# Patient Record
Sex: Female | Born: 1965 | Race: White | Hispanic: No | Marital: Married | Smoking: Never smoker
Health system: Southern US, Community
[De-identification: ages and names within clinical notes are randomized; demographics above are authoritative.]

## PROBLEM LIST (undated history)

## (undated) ENCOUNTER — Emergency Department (HOSPITAL_COMMUNITY): Discharge status: Absent without leave | Payer: Self-pay

---

## 1997-10-17 ENCOUNTER — Other Ambulatory Visit: Admission: RE | Admit: 1997-10-17 | Discharge: 1997-10-17 | Payer: Self-pay | Admitting: Obstetrics and Gynecology

## 1997-11-01 ENCOUNTER — Ambulatory Visit (HOSPITAL_COMMUNITY): Admission: RE | Admit: 1997-11-01 | Discharge: 1997-11-01 | Payer: Self-pay | Admitting: Obstetrics and Gynecology

## 1998-01-09 ENCOUNTER — Other Ambulatory Visit: Admission: RE | Admit: 1998-01-09 | Discharge: 1998-01-09 | Payer: Self-pay | Admitting: Obstetrics and Gynecology

## 1999-01-14 ENCOUNTER — Ambulatory Visit (HOSPITAL_COMMUNITY): Admission: RE | Admit: 1999-01-14 | Discharge: 1999-01-14 | Payer: Self-pay | Admitting: Obstetrics and Gynecology

## 1999-01-14 ENCOUNTER — Encounter: Payer: Self-pay | Admitting: Obstetrics and Gynecology

## 1999-07-25 ENCOUNTER — Other Ambulatory Visit: Admission: RE | Admit: 1999-07-25 | Discharge: 1999-07-25 | Payer: Self-pay | Admitting: Obstetrics and Gynecology

## 2001-01-19 ENCOUNTER — Other Ambulatory Visit: Admission: RE | Admit: 2001-01-19 | Discharge: 2001-01-19 | Payer: Self-pay | Admitting: Obstetrics and Gynecology

## 2002-03-07 ENCOUNTER — Other Ambulatory Visit: Admission: RE | Admit: 2002-03-07 | Discharge: 2002-03-07 | Payer: Self-pay | Admitting: Obstetrics and Gynecology

## 2003-04-17 ENCOUNTER — Other Ambulatory Visit: Admission: RE | Admit: 2003-04-17 | Discharge: 2003-04-17 | Payer: Self-pay | Admitting: Obstetrics and Gynecology

## 2004-04-02 ENCOUNTER — Ambulatory Visit (HOSPITAL_COMMUNITY): Admission: RE | Admit: 2004-04-02 | Discharge: 2004-04-02 | Payer: Self-pay | Admitting: Obstetrics and Gynecology

## 2004-05-08 ENCOUNTER — Other Ambulatory Visit: Admission: RE | Admit: 2004-05-08 | Discharge: 2004-05-08 | Payer: Self-pay | Admitting: Obstetrics and Gynecology

## 2004-06-05 ENCOUNTER — Ambulatory Visit (HOSPITAL_COMMUNITY): Admission: RE | Admit: 2004-06-05 | Discharge: 2004-06-05 | Payer: Self-pay | Admitting: Obstetrics and Gynecology

## 2004-08-27 ENCOUNTER — Encounter: Admission: RE | Admit: 2004-08-27 | Discharge: 2004-08-27 | Payer: Self-pay | Admitting: Obstetrics and Gynecology

## 2004-10-06 ENCOUNTER — Ambulatory Visit (HOSPITAL_COMMUNITY): Admission: RE | Admit: 2004-10-06 | Discharge: 2004-10-06 | Payer: Self-pay | Admitting: Obstetrics and Gynecology

## 2004-10-15 ENCOUNTER — Inpatient Hospital Stay (HOSPITAL_COMMUNITY): Admission: AD | Admit: 2004-10-15 | Discharge: 2004-10-18 | Payer: Self-pay | Admitting: Obstetrics and Gynecology

## 2005-07-01 ENCOUNTER — Encounter: Payer: Self-pay | Admitting: Obstetrics and Gynecology

## 2005-08-18 ENCOUNTER — Other Ambulatory Visit: Admission: RE | Admit: 2005-08-18 | Discharge: 2005-08-18 | Payer: Self-pay | Admitting: Obstetrics and Gynecology

## 2005-08-24 ENCOUNTER — Encounter: Admission: RE | Admit: 2005-08-24 | Discharge: 2005-08-24 | Payer: Self-pay | Admitting: Obstetrics and Gynecology

## 2005-09-08 ENCOUNTER — Encounter: Admission: RE | Admit: 2005-09-08 | Discharge: 2005-09-08 | Payer: Self-pay | Admitting: General Surgery

## 2005-09-08 ENCOUNTER — Encounter (INDEPENDENT_AMBULATORY_CARE_PROVIDER_SITE_OTHER): Payer: Self-pay | Admitting: Specialist

## 2005-12-22 ENCOUNTER — Encounter: Admission: RE | Admit: 2005-12-22 | Discharge: 2005-12-22 | Payer: Self-pay | Admitting: Obstetrics and Gynecology

## 2006-04-20 ENCOUNTER — Encounter: Admission: RE | Admit: 2006-04-20 | Discharge: 2006-04-20 | Payer: Self-pay | Admitting: General Surgery

## 2006-09-21 IMAGING — US US OB COMP LESS 14 WK
1 series · 7 of 7 positions shown · non-contrast
Comparison: none

CLINICAL DATA: Questionable dates.  
EARLY OBSTETRICAL ULTRASOUND: 
Multiple images of the uterus and adnexa were obtained using a transabdominal and endovaginal approaches.
There is a single intrauterine pregnancy identified that demonstrates an estimated gestational age by crown rump length of 11 weeks and 4 days.  Positive regular fetal cardiac activity with a rate of 144 bpm was noted.  A normal appearing yolk sac and amnion are seen.  No signs of subchorionic hemorrhage are evident.  Attempts at obtaining a correct position for nuchal translucency evaluation was not possible today.  
Both ovaries were seen and have a normal appearance with the right ovary measuring 3.1 x 1.9 x 2.3 cm and the left ovary measuring 2.4 x 3.2 x 1.4 cm.    No cul-de-sac or periovarian fluid is seen and no separate adnexal masses are apparent.

[Series 1: us ob comp less 14 wk · 0.33mm/px · 7 of 7 slices shown]
[im 1/7]
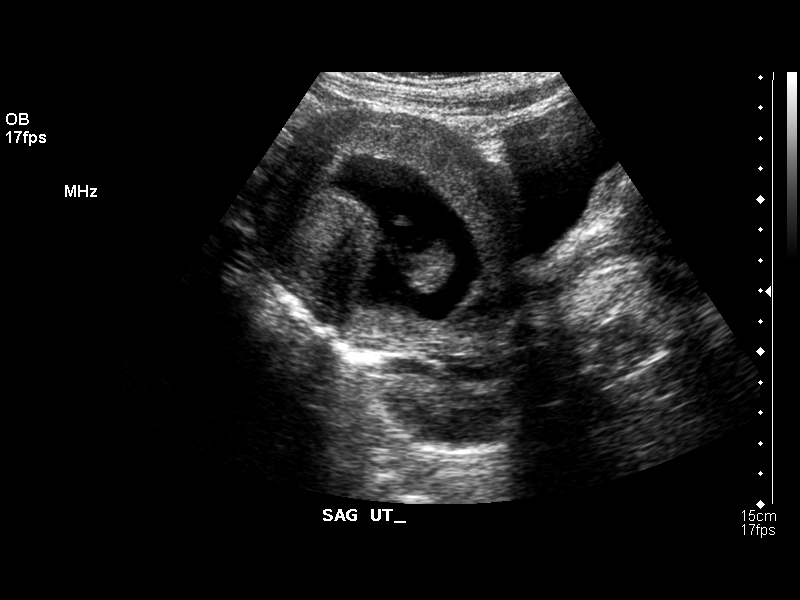
[im 2/7]
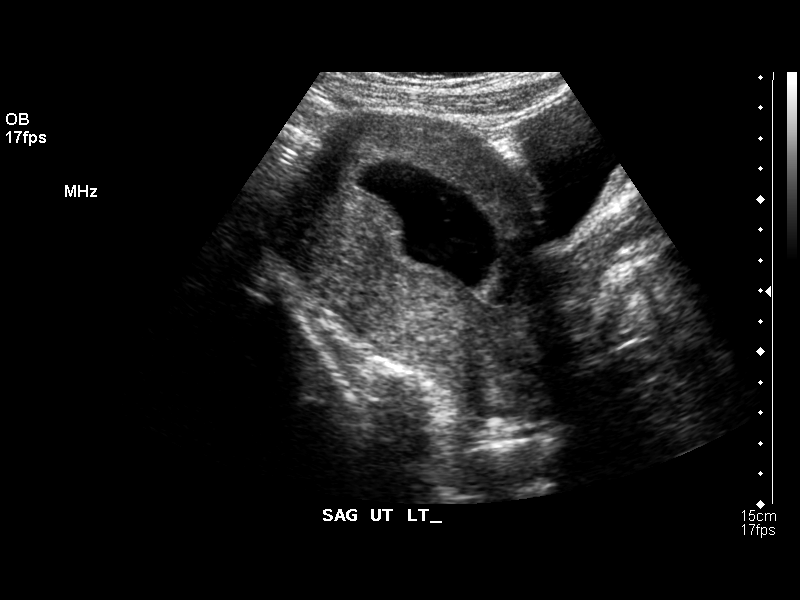
[im 3/7]
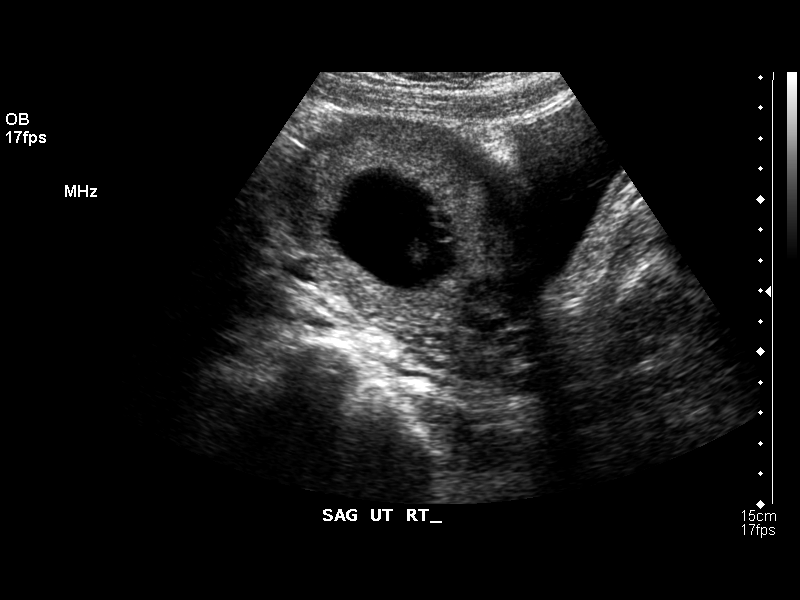
[im 4/7]
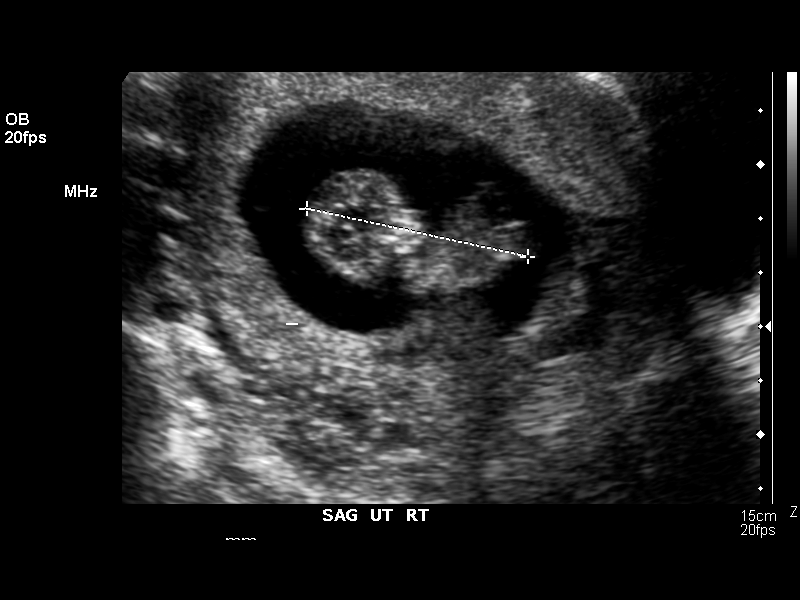
[im 5/7]
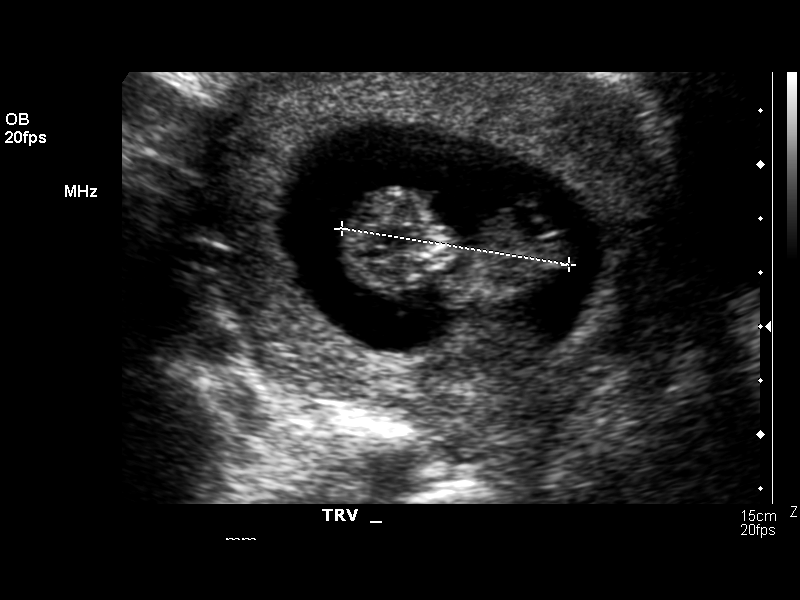
[im 6/7]
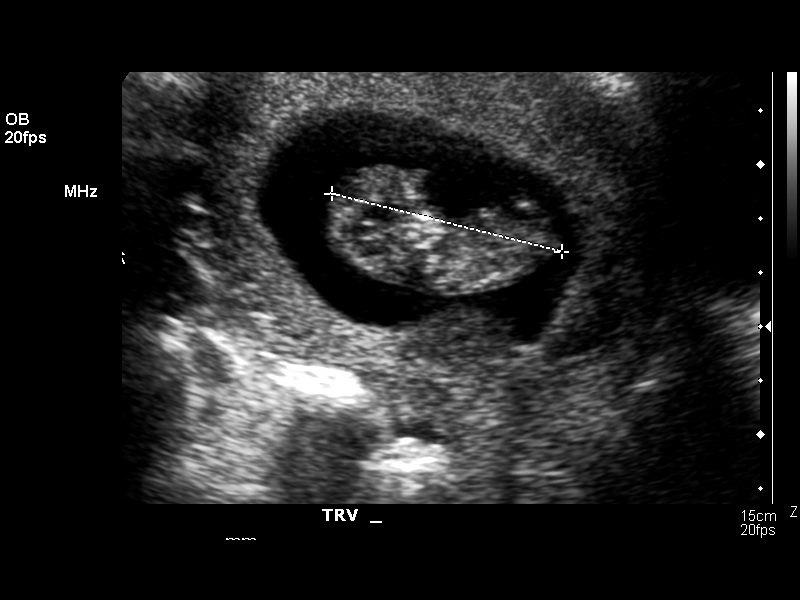
[im 7/7]
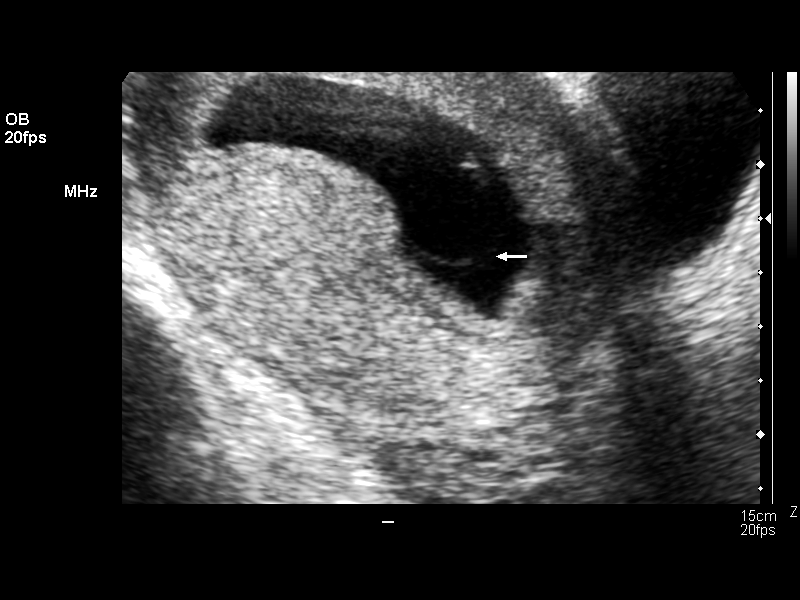

[7 of 7 positions shown; findings below may reference images not displayed]

IMPRESSION: 11 week 4 day living intrauterine pregnancy.  Normal ovaries.

## 2006-11-23 ENCOUNTER — Encounter (HOSPITAL_BASED_OUTPATIENT_CLINIC_OR_DEPARTMENT_OTHER): Payer: Self-pay | Admitting: General Surgery

## 2006-11-23 ENCOUNTER — Ambulatory Visit (HOSPITAL_BASED_OUTPATIENT_CLINIC_OR_DEPARTMENT_OTHER): Admission: RE | Admit: 2006-11-23 | Discharge: 2006-11-23 | Payer: Self-pay | Admitting: General Surgery

## 2006-11-24 IMAGING — US US OB DETAIL+14 WK
1 series · 13 of 28 positions shown · non-contrast
Comparison: none

CLINICAL DATA: Advanced maternal age. Assess fetal anatomy.  No current problems.

[Series 1: us ob detail+14 wk · 0.29mm/px · 13 of 83 slices shown]
[im 4/83]
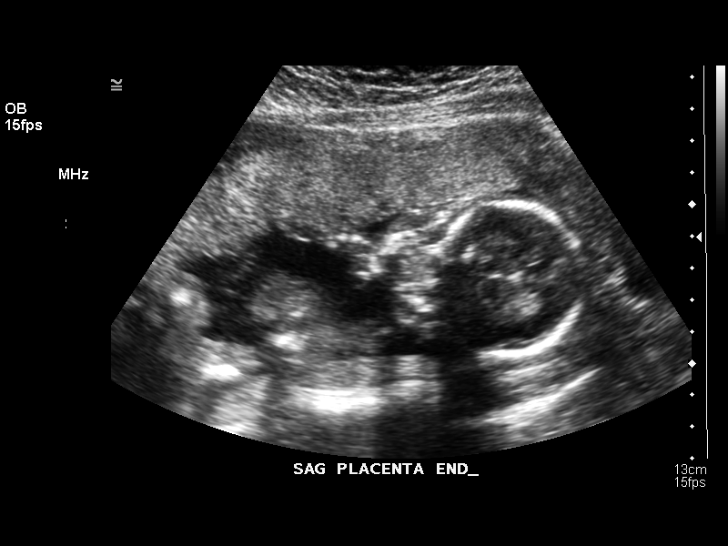
[im 10/83]
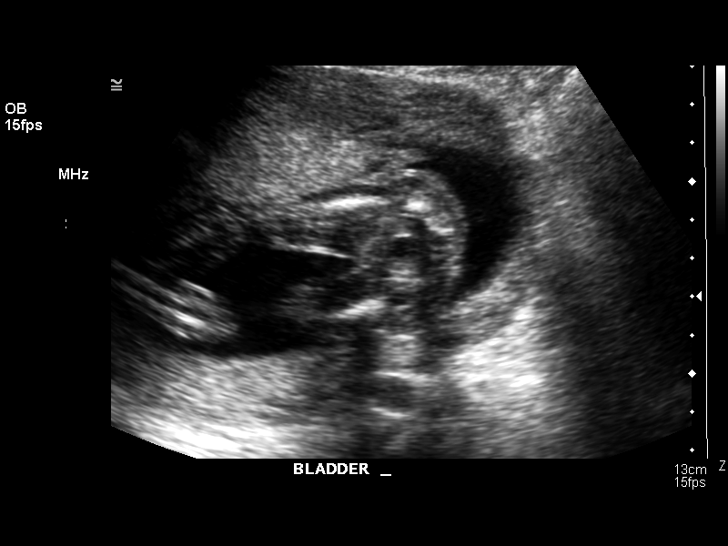
[im 16/83]
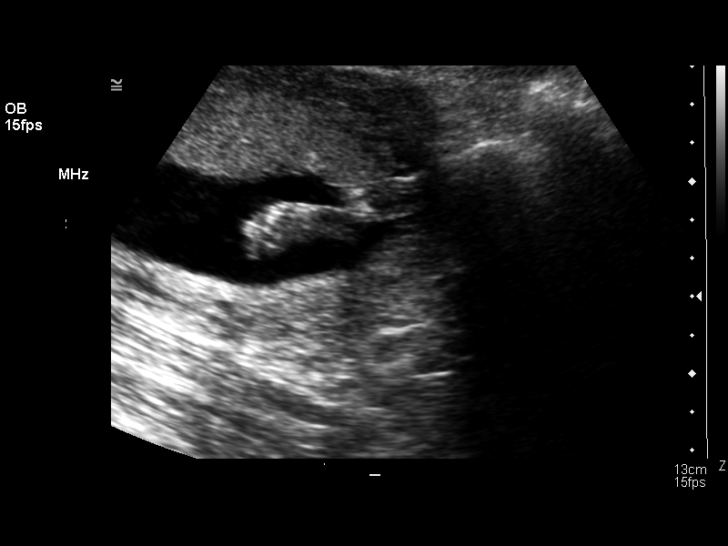
[im 22/83]
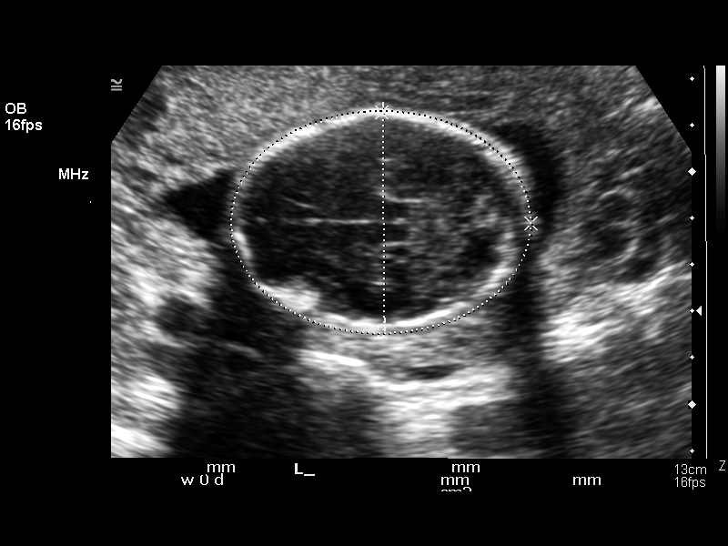
[im 28/83]
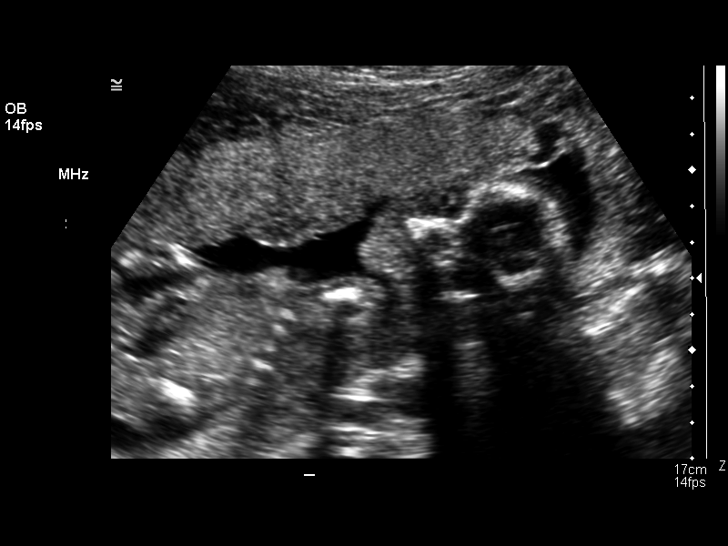
[im 34/83]
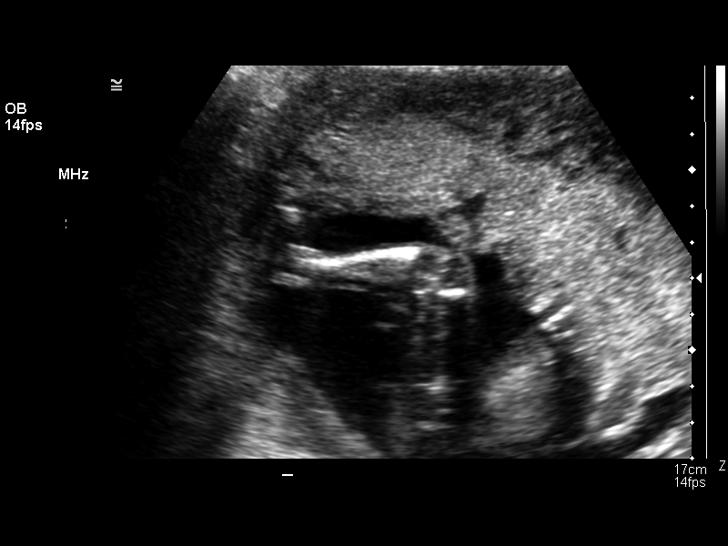
[im 43/83]
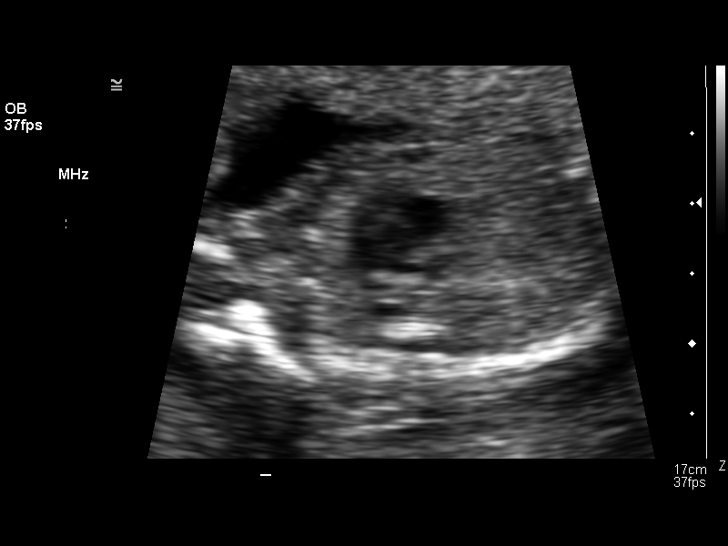
[im 49/83]
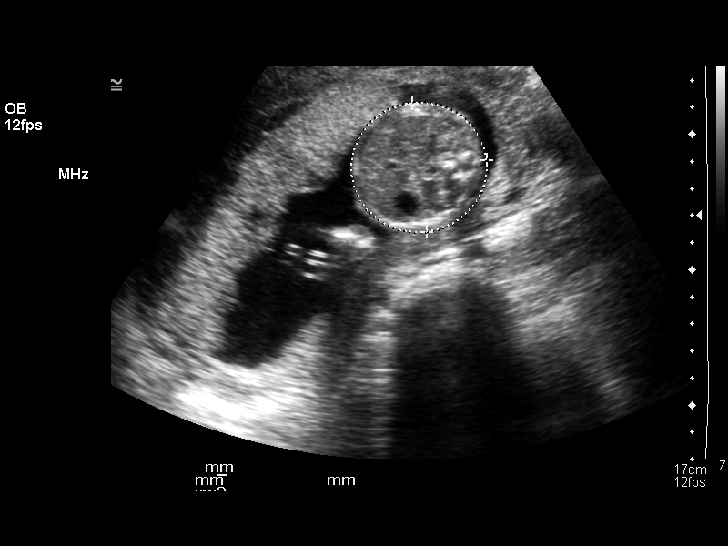
[im 55/83]
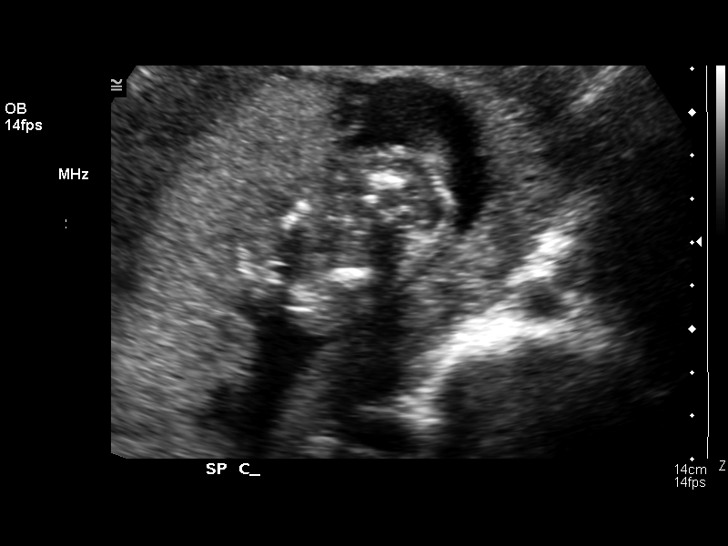
[im 61/83]
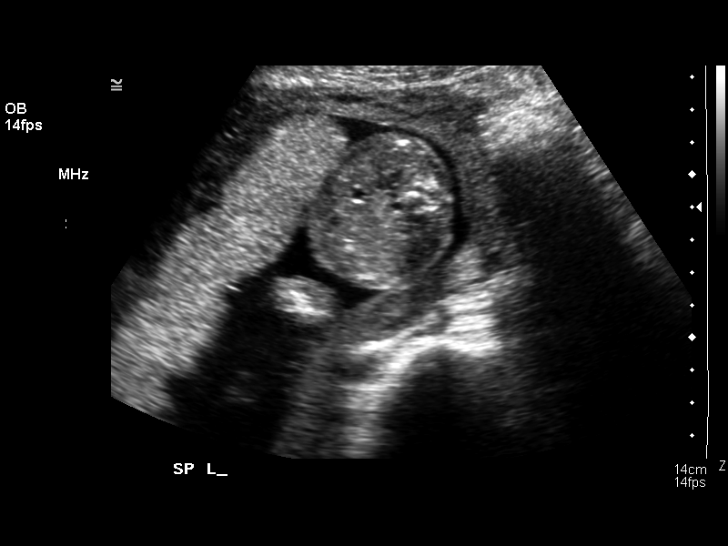
[im 67/83]
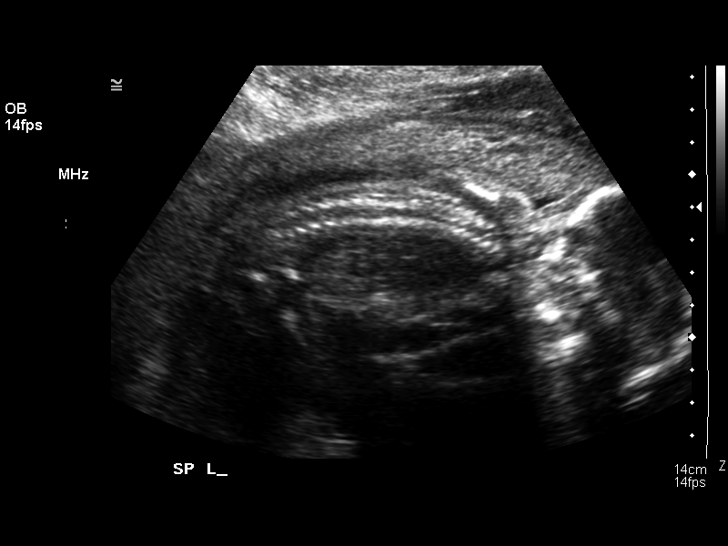
[im 73/83]
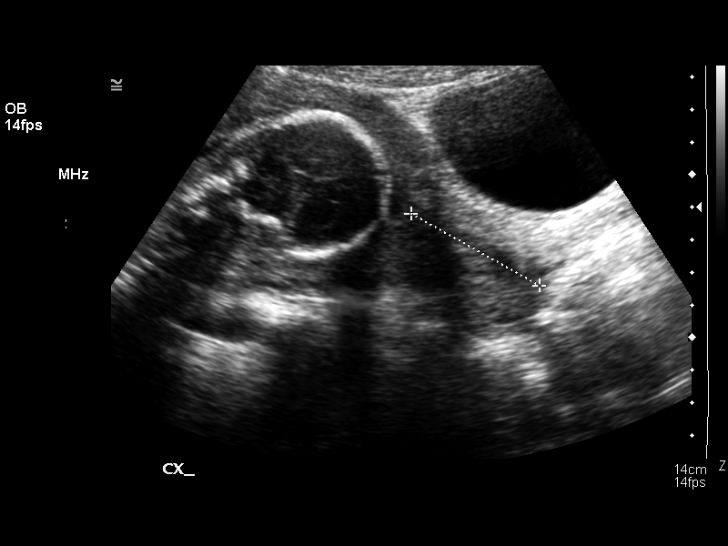
[im 79/83]
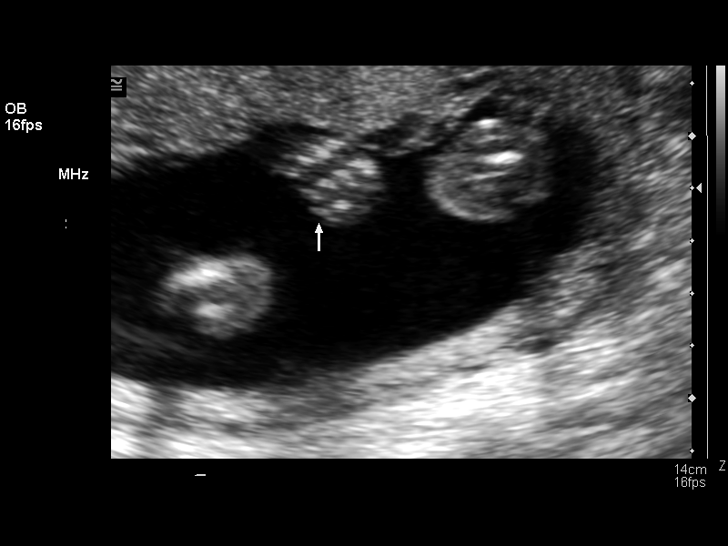

[13 of 28 positions shown; findings below may reference images not displayed]

DETAILED OBSTETRICAL ULTRASOUND:

Number of Fetuses: 1
Heart Rate:  147
Movement:  Yes
Breathing:  No
Presentation:  Cephalic
Placental Location:  Anterior
Grade:  I
Previa:  No
Amniotic Fluid (Subjective):  Normal
Amniotic Fluid (Objective):  4.3 cm Vertical pocket 

FETAL BIOMETRY
BPD:  4.7 cm   20 w 0 d
HC:  17.8 cm  20 w 2 d
AC:  15.6 cm   20 w 6 d
FL:  3.3 cm   20 w 2 d
HL:  3.3 cm   21 w 2 d

MEAN GA:  20 w 4 d
Assigned GA:  20 w 5 d

FETAL ANATOMY
Lateral Ventricles:  Visualized 
Thalami/CSP:  Visualized 
Posterior Fossa:  Visualized 
Nuchal Region:  N/A
Spine:  Visualized 
4 Chamber Heart on Left:  Visualized 
Stomach on Left:  Visualized 
3 Vessel Cord:  Visualized 
Cord Insertion Site:  Visualized 
Kidneys:  Visualized 
Bladder:  Visualized 
Extremities:  Visualized 

ADDITIONAL ANATOMY VISUALIZED:  LVOT, RVOT, upper lip, orbits, profile, diaphragm, heel, 5th digit, ductal arch, and aortic arch.
Comment:  A nasal bone is visualized.

MATERNAL UTERINE AND ADNEXAL FINDINGS
Cervix:  4.5 cm Transabdominally
IMPRESSION: 1.  Single intrauterine pregnancy demonstrating an estimated gestational age by ultrasound of 20 weeks and 4 days.  Correlation with assigned gestational age by early ultrasound of 20 weeks and 5 days suggests appropriate growth.
2.  No focal fetal or placental abnormalities are noted with a good anatomic exam possible.  Specifically, no soft markers for Down syndrome were identified.  Given the patient?s preultrasound odds risk ratio for Down syndrome at age 4[REDACTED]. today?s normal ultrasound would decrease the patient?s odds risk ratio for Down syndrome to [DATE].
Today?s scan findings and their significance were discussed with the patient and her husband.  A preliminary copy of today?s report was sent with the patient to an office appointment immediately following today?s exam. 

</u12:p>

## 2007-03-27 IMAGING — US US OB FOLLOW-UP
1 series · 13 of 28 positions shown · non-contrast
Comparison: none

CLINICAL DATA: Assess estimated fetal weight.

[Series 1: us ob follow-up · 0.25mm/px · 34 acquisitions, 13 frames shown]
[im 2/34]
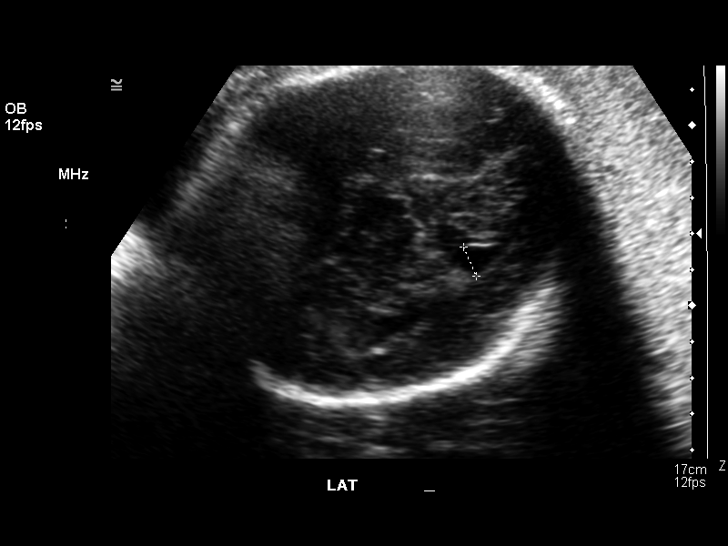
[im 4/34]
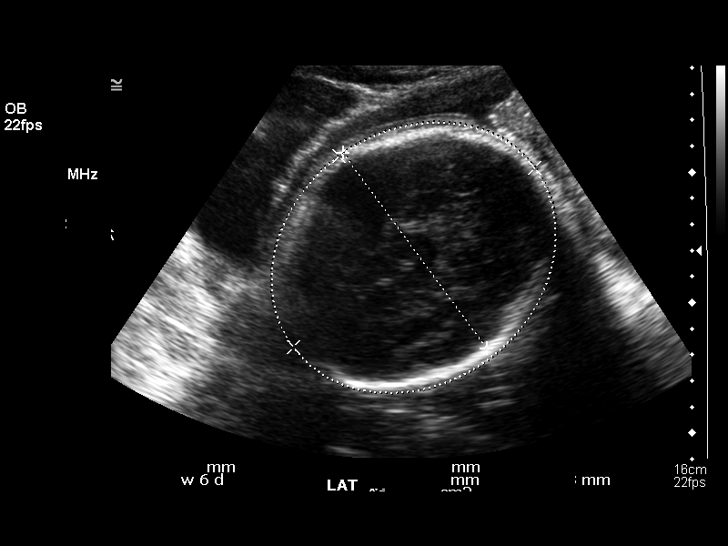
[im 7/34]
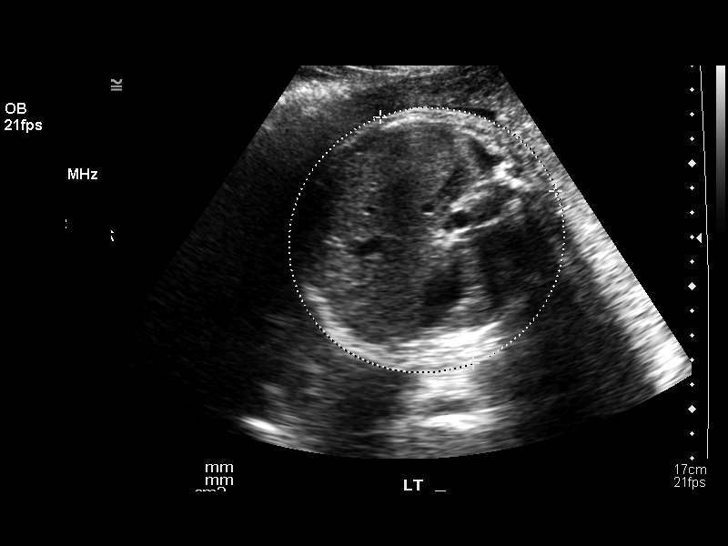
[im 9/34]
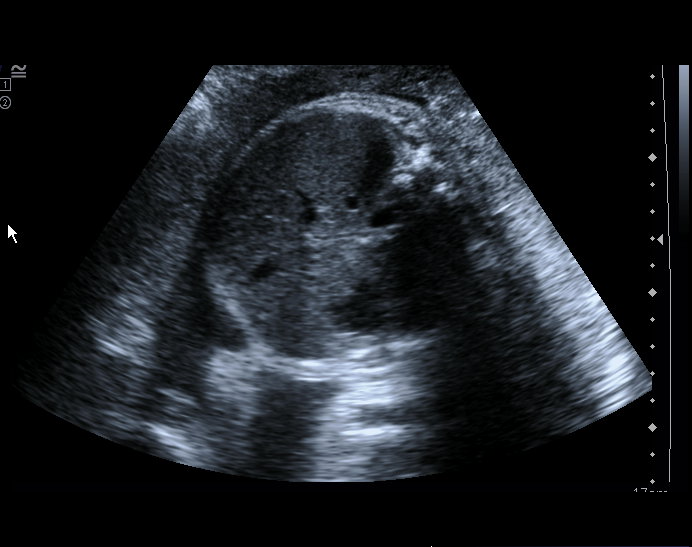
[im 12/34]
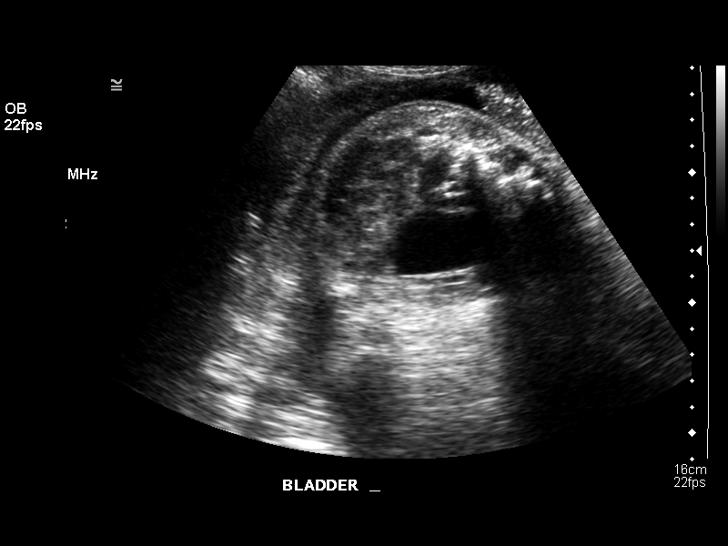
[im 14/34]
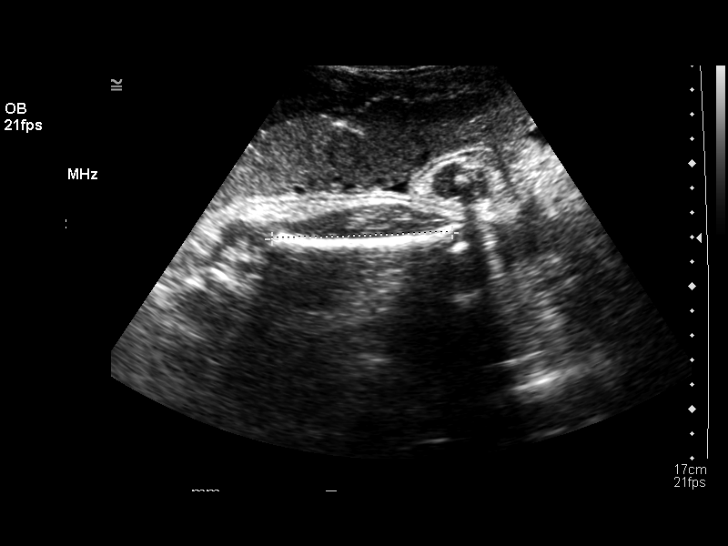
[im 18/34]
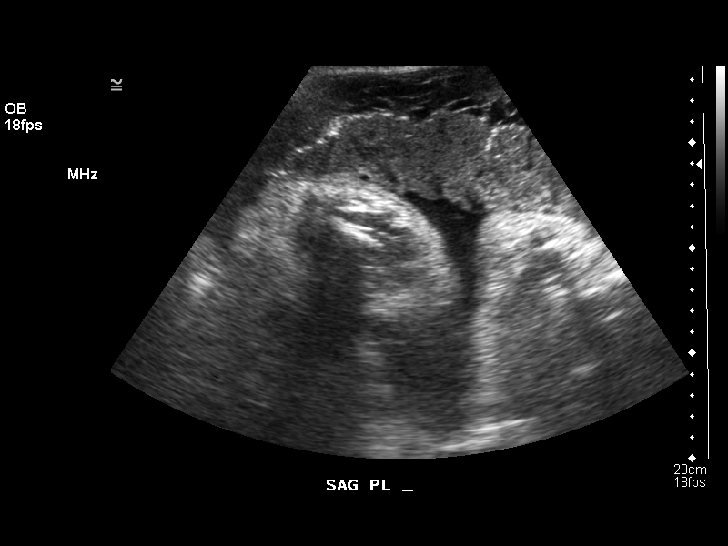
[im 20/34]
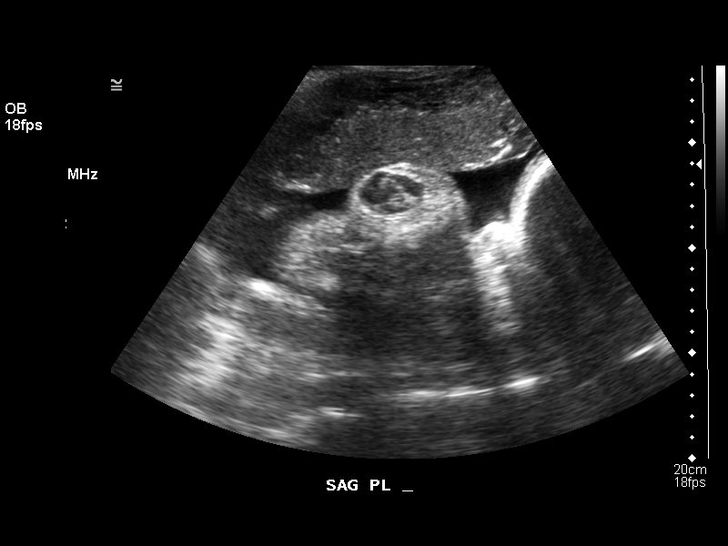
[im 23/34]
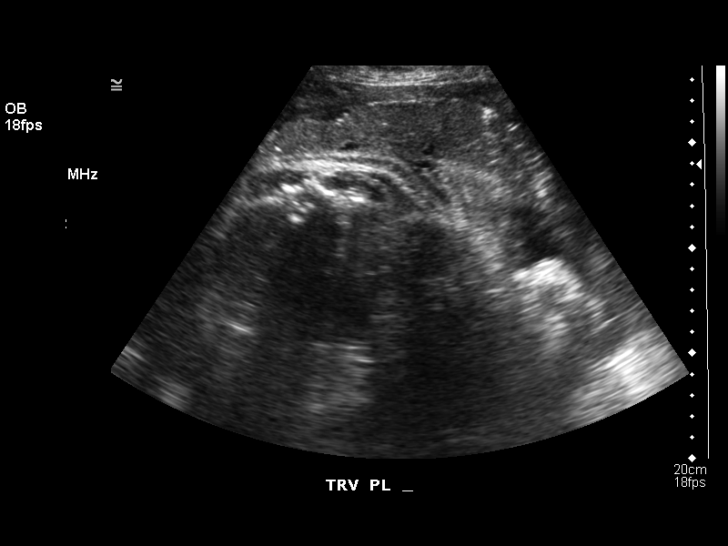
[im 25/34]
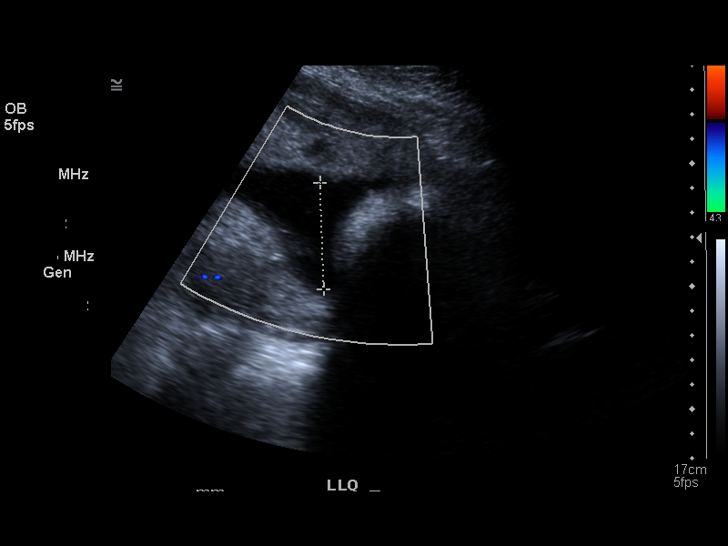
[im 27/34]
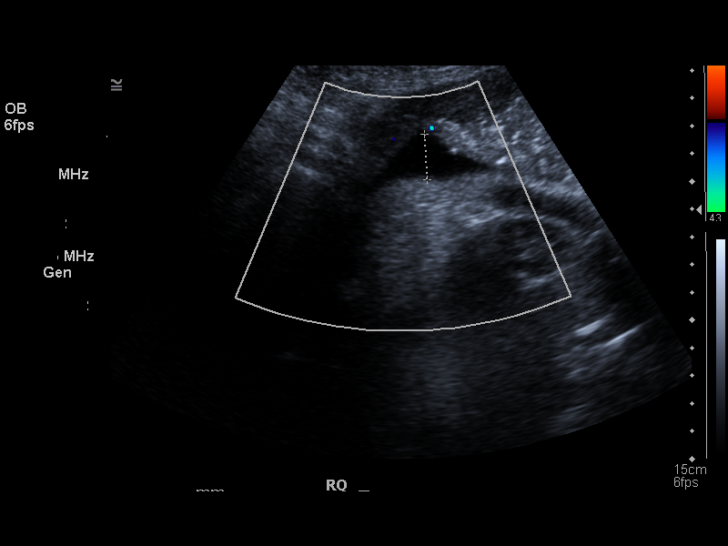
[im 30/34]
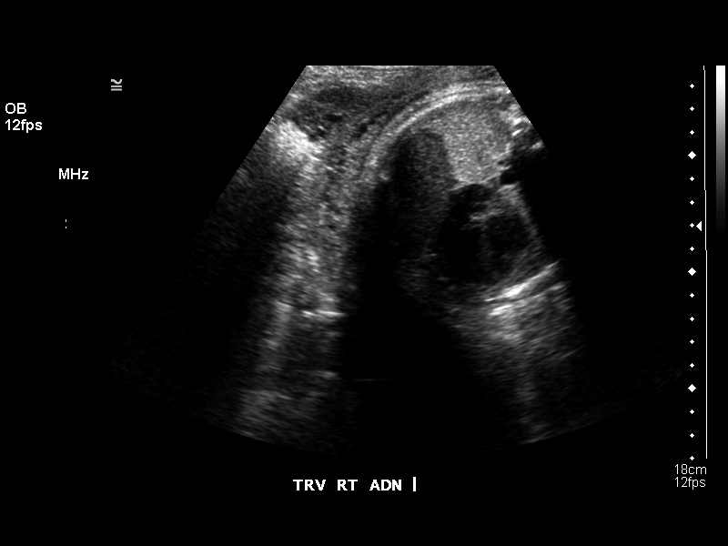
[im 32/34]
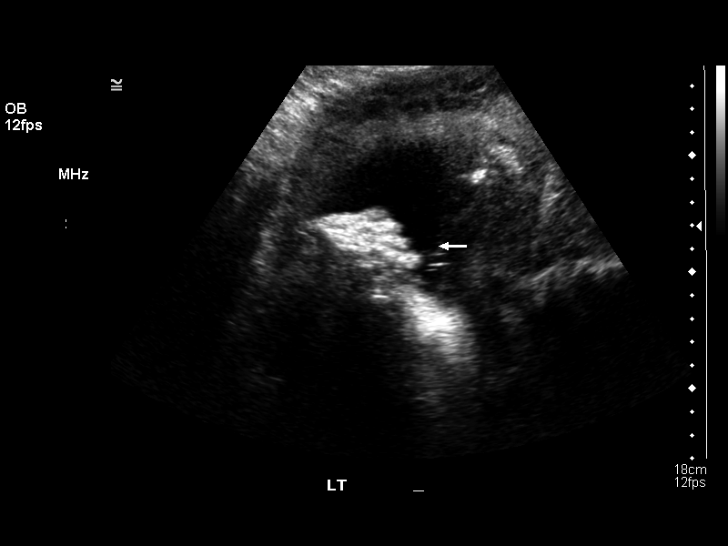

[13 of 28 positions shown; findings below may reference images not displayed]

OBSTETRICAL ULTRASOUND RE-EVALUATION:
Number of Fetuses:  1
Heart Rate:  36
Movement:  Yes
Breathing:  Yes
Presentation:  Cephalic
Placental Location:  Anterior
Grade:  II
Previa:  No
Amniotic Fluid (subjective):  Normal
Amniotic Fluid (objective):  12.9 cm AFI (5th -95th%ile = 7.3 ? 23.9 cm for 38 wks)

FETAL BIOMETRY
BPD:  9.3 cm   38 w 0 d
HC:  33.5 cm   38 w 2 d
AC:  34.0 cm   38 w 0 d
FL:  7.5 cm   38 w 1 d

Mean GA:  38 w 1 d
Assigned GA:  38 w 2 d
Fetal indices are within normal limits.
EFW:  4464 g (H) 50th ? 75th%ile (3743 ? 5469 g) For 38 wks

FETAL ANATOMY
Lateral Ventricles:  Visualized 
Thalami/CSP:  Previously seen 
Posterior Fossa:  Previously seen 
Nuchal Region:  Previously seen 
Spine:  Previously seen 
4 Chamber Heart on Left:  Previously seen 
Stomach on Left:  Visualized 
3 Vessel Cord:  Visualized 
Cord Insertion Site:  Previously seen 
Kidneys:  Visualized 
Bladder:  Visualized 
Extremities:  Previously seen 

ADDITIONAL ANATOMY VISUALIZED:  Diaphragm and female genitalia.

Evaluation limited by:  Fetal position and advanced gestational age.  

MATERNAL UTERINE AND ADNEXAL FINDINGS
Cervix:  Not evaluated
IMPRESSION: 1.  Single intrauterine pregnancy demonstrating an estimated gestational age by ultrasound of 38 weeks and 1 day.  Correlation with assigned gestational age of 38 weeks and 2 days suggests appropriate growth.  Currently the estimated fetal weight is between the 50th and 75th percentile for a 38 week gestation.
2.  Subjectively and quantitatively normal amniotic fluid volume.  
3.  No late developing fetal anatomic abnormalities are identified associated with the lateral ventricles, stomach, kidneys, or bladder.

## 2007-09-20 ENCOUNTER — Encounter: Admission: RE | Admit: 2007-09-20 | Discharge: 2007-09-20 | Payer: Self-pay | Admitting: Obstetrics and Gynecology

## 2009-01-31 ENCOUNTER — Encounter: Admission: RE | Admit: 2009-01-31 | Discharge: 2009-01-31 | Payer: Self-pay | Admitting: Obstetrics and Gynecology

## 2010-02-11 ENCOUNTER — Encounter
Admission: RE | Admit: 2010-02-11 | Discharge: 2010-02-11 | Payer: Self-pay | Source: Home / Self Care | Attending: Obstetrics and Gynecology | Admitting: Obstetrics and Gynecology

## 2010-07-15 NOTE — Op Note (Signed)
NAME:  Robin Wolf, Robin Wolf               ACCOUNT NO.:  [id number]   MEDICAL RECORD NO.:  [id number]          PATIENT TYPE:  AMB   LOCATION:  DSC                          FACILITY:  [facility]   PHYSICIAN:  Leonie Man, M.D.   DATE OF BIRTH:  01/31/1966   DATE OF PROCEDURE:  11/23/2006  DATE OF DISCHARGE:                               OPERATIVE REPORT   PREOPERATIVE DIAGNOSIS:  Left breast mass, rule out carcinoma.   POSTOPERATIVE DIAGNOSIS:  Left breast mass, rule out carcinoma.   PROCEDURE:  Excisional biopsy left breast mass.   SURGEON:  Leonie Man, M.D.   ASSISTANT:  OR tech.   ANESTHESIA:  General.   The patient is a 45 year old female who developed a mobile left breast  mass during the course of pregnancy and which persisted during her  period of breast feeding.  This area has persisted following breast-  feeding.  Needle core biopsies really just show fibrocystic changes of  this area, however, the area has persisted and the patient is concerned  about the persistence of this mass.  Because of its indeterminate  nature, she comes to the operating room now for excision of this area.  She understands the risks and potential benefits of surgery and gives  her consent to same.   Following the induction of satisfactory general anesthesia, the patient  is positioned supinely and left breast is prepped and draped to be  included in the sterile operative field.  Positive identification of the  patient and the side as left was made.  I then made an inframammary  incision in the area of the lower outer quadrant of the left breast and  deepened this through skin and subcutaneous tissues and dissected  superiorly and medially towards the region of the mass.  The area on  palpation did feel much like fibrocystic disease.  This area was removed  by electrocautery and forwarded for pathologic evaluation.  Hemostasis  was obtained with electrocautery.  Sponge and instrument counts were  verified.  The breast tissues were reapproximated using interrupted 2-0  Vicryl sutures.  The subcutaneous tissues closed with 3-0 Vicryl sutures  and the skin closed with a 5-0 Monocryl suture.  This was then  reinforced with Steri-Strips.  A sterile compressive dressing was  applied.  The anesthetic reversed and the patient removed from the  operating room to the recovery room in stable condition.  She tolerated  the procedure well.      Leonie Man, M.D.  Electronically Signed     PB/MEDQ  D:  11/23/2006  T:  11/23/2006  Job:  [id number]

## 2010-07-18 NOTE — Discharge Summary (Signed)
NAME:  Robin Wolf, Robin Wolf               ACCOUNT NO.:  [id number]   MEDICAL RECORD NO.:  [id number]          PATIENT TYPE:  INP   LOCATION:  9167                          FACILITY:  [facility]   PHYSICIAN:  Hal Morales, M.D.DATE OF BIRTH:  [birth year]   DATE OF ADMISSION:  10/15/2004  DATE OF DISCHARGE:                                 DISCHARGE SUMMARY   HISTORY OF PRESENT ILLNESS:  Ms. Robin Wolf is a 45 year old gravida 1, para 0  at term with pregnancy complicated by diet-controlled gestational diabetes.  She presents for induction of labor. Cytotec was placed last night on  admission and she had spontaneous rupture of membranes at approximately 2:00  a.m. Contractions are now q. 2 minutes and Pitocin has been started. She  does report positive fetal movement, no vaginal bleeding and denied any PIH  symptoms, no headache, visual changes or epigastric pain. Her pregnancy has  been followed by the MD service at [facility] and is remarkable for advanced  maternal age. She had first trimester genetic screening at [facility] and that was within normal limits. She declined amniocentesis and she  is group B strep negative.  This patient was initially evaluated for  prenatal care at [facility] on April 07, 2004 at approximately 12 weeks'  gestation, [facility] determined by dates and confirmed with ultrasound. The  patient did consent to first trimester genetics screen. This was done at the  perinatal center and found to be within normal limits. The patient declined  amniocentesis and ultrasound for anatomy at 20 weeks showed normal anatomy  with nasal bone. Down syndrome  risk 1 in 465. Again the patient did decline  amnio. Her pregnancy has been complicated by gestational diabetes. She has  remained diet controlled and has remained in good control of her diabetes  throughout her pregnancy. She has been size equal to dates throughout her  pregnancy, normotensive with no proteinuria. Prenatal lab work on  April 09, 2004; hemoglobin and hematocrit 15.1 and 39.1, platelets 321,000. Blood  type and Rh B+, antibody screen negative. Toxic titers negative and negative  VDRL nonreactive, rubella immune, hepatitis B surface antigen negative, HIV  declined, quad screen within normal limits at 28 weeks, 1 hour glucose  challenge was elevated at 140. 3-hour GTT was abnormal for a diagnosis of  gestational diabetes. She has remained diet controlled and in good control  throughout the remainder of her pregnancy. At 36 weeks culture of the  vaginal tract for group B strep, GC and chlamydia were all negative. OB  history is the present pregnancy.   MEDICAL HISTORY:  Is unremarkable.   FAMILY HISTORY:  The patient's mother with a history of chronic  hypertension. Father with borderline thyroid disease. The patient's father  also has a questionable diagnosis of Parkinson's disease and a maternal  grandmother with a history of stroke. The patient's mother with rheumatoid  arthritis.   GENETIC HISTORY:  Unremarkable. There is no family history of familial or  chromosomal disorders, children that were born with birth defects or that  died in infancy.   ALLERGIES:  The patient has no known drug allergies.   SOCIAL HISTORY:  She denies the use of tobacco, alcohol or illicit drugs.   Ms. Robin Wolf is a 45 year old married Caucasian female. Her husband, Lajoyce Lauber is involved and supportive. They are [place] in their faith.   REVIEW OF SYSTEMS:  Is as described above. The patient is typical of one  with a uterine pregnancy at term, scheduled for induction of labor secondary  to diet-controlled gestational diabetes.   PHYSICAL EXAM:  VITAL SIGNS: Were stable. The patient is afebrile.  HEENT is unremarkable.  HEART:  Regular rate and rhythm.  LUNGS:  Clear.  ABDOMEN:  Abdomen is gravid in its contour. Uterine fundus is noted to  extend 40 cm above the level of the pubic symphysis. Leopold's  maneuvers  finds the infant to be in a longitudinal lie, cephalic presentation and the  estimated fetal weight of 7-1/2 pounds. A baseline of the fetal heart rate  monitor is 140s with average long-term variability and reactivity is present  with no periodic changes. The patient is contracting every 2 minutes on  Pitocin at 313 milliunits per minute.  EXTREMITIES:  Extremities showed no pathologic edema. DTRs were 1+ with no  clonus.   ASSESSMENT:  Intrauterine pregnancy at term, induction of labor, gestational  diabetes diet controlled.   PLAN:  Admit per Hal Morales, M.D. Routine M.D. orders. The patient  will receive Cytotec for cervical ripening overnight and then begin Pitocin  in the morning.      Rica Koyanagi, C.N.M.      Hal Morales, M.D.  Electronically Signed    SDM/MEDQ  D:  10/16/2004  T:  10/16/2004  Job:  [id number]

## 2010-12-11 LAB — POCT HEMOGLOBIN-HEMACUE: Operator id: 112821

## 2011-01-06 ENCOUNTER — Other Ambulatory Visit: Payer: Self-pay | Admitting: Obstetrics and Gynecology

## 2011-01-06 DIAGNOSIS — Z1231 Encounter for screening mammogram for malignant neoplasm of breast: Secondary | ICD-10-CM

## 2011-02-16 ENCOUNTER — Ambulatory Visit
Admission: RE | Admit: 2011-02-16 | Discharge: 2011-02-16 | Disposition: A | Discharge status: Home / Self Care | Payer: BC Managed Care – PPO | Source: Ambulatory Visit | Attending: Obstetrics and Gynecology | Admitting: Obstetrics and Gynecology

## 2011-02-16 DIAGNOSIS — Z1231 Encounter for screening mammogram for malignant neoplasm of breast: Secondary | ICD-10-CM

## 2011-02-19 ENCOUNTER — Other Ambulatory Visit: Payer: Self-pay | Admitting: Obstetrics and Gynecology

## 2011-02-19 DIAGNOSIS — R928 Other abnormal and inconclusive findings on diagnostic imaging of breast: Secondary | ICD-10-CM

## 2011-03-11 ENCOUNTER — Ambulatory Visit
Admission: RE | Admit: 2011-03-11 | Discharge: 2011-03-11 | Disposition: A | Discharge status: Home / Self Care | Payer: BC Managed Care – PPO | Source: Ambulatory Visit | Attending: Obstetrics and Gynecology | Admitting: Obstetrics and Gynecology

## 2011-03-11 DIAGNOSIS — R928 Other abnormal and inconclusive findings on diagnostic imaging of breast: Secondary | ICD-10-CM

## 2018-08-15 ENCOUNTER — Other Ambulatory Visit: Payer: Self-pay | Admitting: Obstetrics and Gynecology

## 2021-05-31 ENCOUNTER — Other Ambulatory Visit: Payer: Self-pay

## 2021-05-31 ENCOUNTER — Emergency Department (HOSPITAL_BASED_OUTPATIENT_CLINIC_OR_DEPARTMENT_OTHER)
Admission: EM | Admit: 2021-05-31 | Discharge: 2021-05-31 | Disposition: A | Discharge status: Home / Self Care | Payer: No Typology Code available for payment source | Attending: Emergency Medicine | Admitting: Emergency Medicine

## 2021-05-31 ENCOUNTER — Encounter (HOSPITAL_BASED_OUTPATIENT_CLINIC_OR_DEPARTMENT_OTHER): Payer: Self-pay | Admitting: Emergency Medicine

## 2021-05-31 DIAGNOSIS — S6991XA Unspecified injury of right wrist, hand and finger(s), initial encounter: Secondary | ICD-10-CM | POA: Diagnosis present

## 2021-05-31 DIAGNOSIS — Z23 Encounter for immunization: Secondary | ICD-10-CM | POA: Diagnosis not present

## 2021-05-31 DIAGNOSIS — W268XXA Contact with other sharp object(s), not elsewhere classified, initial encounter: Secondary | ICD-10-CM | POA: Diagnosis not present

## 2021-05-31 DIAGNOSIS — S61211A Laceration without foreign body of left index finger without damage to nail, initial encounter: Secondary | ICD-10-CM

## 2021-05-31 DIAGNOSIS — S61214A Laceration without foreign body of right ring finger without damage to nail, initial encounter: Secondary | ICD-10-CM | POA: Diagnosis not present

## 2021-05-31 MED ORDER — LIDOCAINE HCL (PF) 1 % IJ SOLN
5.0000 mL | Freq: Once | INTRAMUSCULAR | Status: AC
  Administered 2021-05-31: 5 mL
  Filled 2021-05-31: qty 5

## 2021-05-31 MED ORDER — TETANUS-DIPHTH-ACELL PERTUSSIS 5-2.5-18.5 LF-MCG/0.5 IM SUSY
0.5000 mL | PREFILLED_SYRINGE | Freq: Once | INTRAMUSCULAR | Status: AC
  Administered 2021-05-31: 0.5 mL via INTRAMUSCULAR
  Filled 2021-05-31: qty 0.5

## 2021-05-31 NOTE — ED Provider Notes (Signed)
?[facility] EMERG[facility] DEPT ?Provider Note ? ? ?CSN: [id number] ?Arrival date & time: 05/31/21  0719 ? ?  ? ?History ? ?Chief Complaint  ?Patient presents with  ? Finger Injury  ? ? ?Robin Wolf is a 56 y.o. female. ? ?HPI ?56 year old female presents today complaining of finger laceration occurred approximately 30 minutes prior to evaluation.  She states that she had a coffee mug that broke and cut her finger this morning.  She been trying to hold pressure on it.  It is on her right first finger.  The pain is moderate.  She denies any difficulty moving the finger or change in sensation.  She is right-handed.  She denies any past medical history, medications, or allergies ?  ? ?Home Medications ?Prior to Admission medications   ?Not on File  ?   ? ?Allergies    ?Patient has no known allergies.   ? ?Review of Systems   ?Review of Systems  ?All other systems reviewed and are negative. ? ?Physical Exam ?Updated Vital Signs ?BP (!) 139/100   Pulse 98   Temp 99 ?F (37.2 ?C)   Resp 16   Ht 1.676 m (5\' 6" )   Wt 59 kg   SpO2 100%   BMI 20.98 kg/m?  ?Physical Exam ?Vitals and nursing note reviewed.  ?Constitutional:   ?   General: She is not in acute distress. ?   Appearance: She is well-developed.  ?HENT:  ?   Head: Normocephalic and atraumatic.  ?   Right Ear: External ear normal.  ?   Left Ear: External ear normal.  ?   Nose: Nose normal.  ?Eyes:  ?   Conjunctiva/sclera: Conjunctivae normal.  ?   Pupils: Pupils are equal, round, and reactive to light.  ?Pulmonary:  ?   Effort: Pulmonary effort is normal.  ?Musculoskeletal:     ?   General: Normal range of motion.  ?   Cervical back: Normal range of motion and neck supple.  ?   Comments: Right index finger with 2 cm laceration palmar aspect distal to dip joint ?Subq fat visualized. ?No tendon visualized ?Full arom, sensation intact  ?Skin: ?   General: Skin is warm and dry.  ?   Capillary Refill: Capillary refill takes less than 2 seconds.   ?Neurological:  ?   Mental Status: She is alert and oriented to person, place, and time.  ?   Motor: No abnormal muscle tone.  ?   Coordination: Coordination normal.  ?Psychiatric:     ?   Behavior: Behavior normal.     ?   Thought Content: Thought content normal.  ? ? ?ED Results / Procedures / Treatments   ?Labs ?(all labs ordered are listed, but only abnormal results are displayed) ?Labs Reviewed - No data to display ? ?EKG ?None ? ?Radiology ?No results found. ? ?Procedures ?Marland Kitchen.Laceration Repair ? ?Date/Time: 05/31/2021 7:55 AM ?Performed by: Pattricia Boss, MD ?Authorized by: Pattricia Boss, MD  ? ?Consent:  ?  Consent obtained:  Verbal ?  Risks, benefits, and alternatives were discussed: yes   ?  Risks discussed:  Infection, pain and tendon damage ?  Alternatives discussed:  No treatment ?Universal protocol:  ?  Procedure explained and questions answered to patient or proxy's satisfaction: yes   ?  Immediately prior to procedure, a time out was called: yes   ?  Patient identity confirmed:  Verbally with patient and arm band ?Anesthesia:  ?  Anesthesia method:  Local  infiltration and nerve block ?  Local anesthetic:  Lidocaine 1% w/o epi ?  Block location:  Right second digit ?  Block needle gauge:  25 G ?  Block anesthetic:  Lidocaine 1% w/o epi ?  Block injection procedure:  Anatomic landmarks identified, introduced needle, incremental injection, negative aspiration for blood and anatomic landmarks palpated ?  Block outcome:  Incomplete block ?Laceration details:  ?  Location:  Finger ?  Finger location:  R ring finger ?  Length (cm):  3 ?Pre-procedure details:  ?  Preparation:  Patient was prepped and draped in usual sterile fashion ?Exploration:  ?  Imaging outcome: foreign body not noted   ?  Wound exploration: wound explored through full range of motion and entire depth of wound visualized   ?  Wound extent: no foreign bodies/material noted, no nerve damage noted, no tendon damage noted and no underlying  fracture noted   ?  Contaminated: no   ?Treatment:  ?  Area cleansed with:  Saline ?  Amount of cleaning:  Standard ?  Irrigation solution:  Sterile saline ?  Irrigation method:  Syringe ?  Visualized foreign bodies/material removed: no   ?  Debridement:  None ?  Undermining:  None ?Skin repair:  ?  Repair method:  Sutures ?  Suture size:  4-0 ?  Suture material:  Prolene ?  Suture technique:  Simple interrupted ?  Number of sutures:  3 ?Approximation:  ?  Approximation:  Close ?Repair type:  ?  Repair type:  Simple ?Post-procedure details:  ?  Dressing:  Sterile dressing ?  Procedure completion:  Tolerated  ? ? ?Medications Ordered in ED ?Medications  ?Tdap (BOOSTRIX) injection 0.5 mL (has no administration in time range)  ?lidocaine (PF) (XYLOCAINE) 1 % injection 5 mL (5 mLs Infiltration Given 05/31/21 0746)  ? ? ?ED Course/ Medical Decision Making/ A&P ?  ?                        ?Medical Decision Making ?Risk ?Prescription drug management. ? ? ? ? ? ? ? ? ? ? ?Final Clinical Impression(s) / ED Diagnoses ?Final diagnoses:  ?None  ? ? ?Rx / DC Orders ?ED Discharge Orders   ? ? None  ? ?  ? ? ?  ?Pattricia Boss, MD ?05/31/21 [phone] ? ?

## 2021-05-31 NOTE — ED Notes (Signed)
ED Provider at bedside. 

## 2021-05-31 NOTE — ED Triage Notes (Signed)
Pt cut her right index finger on broken coffee mug.  ?

## 2021-05-31 NOTE — Discharge Instructions (Addendum)
Keep wound clean and dry.  Splint in place.   ?Sutures out 5-7 days. ?Return if any signs or symptoms of infection such as increased redness, pus,or pain.  ?

## 2021-05-31 NOTE — ED Notes (Signed)
Finger splint applied to right index finger. Wound care reviewed with patient. Dc instructions reviewed with patient. Patient voiced understanding. Dc with belongings.  ?

## 2023-09-06 ENCOUNTER — Other Ambulatory Visit: Payer: Self-pay | Admitting: Obstetrics and Gynecology

## 2023-09-06 DIAGNOSIS — Z1231 Encounter for screening mammogram for malignant neoplasm of breast: Secondary | ICD-10-CM
